# Patient Record
Sex: Female | Born: 1990 | Race: White | Hispanic: No | Marital: Single | Smoking: Former smoker
Health system: Southern US, Community
[De-identification: ages and names within clinical notes are randomized; demographics above are authoritative.]

---

## 2010-05-01 ENCOUNTER — Emergency Department (HOSPITAL_COMMUNITY)
Admission: EM | Admit: 2010-05-01 | Discharge: 2010-05-01 | Payer: Self-pay | Source: Home / Self Care | Admitting: Emergency Medicine

## 2010-10-30 LAB — DIFFERENTIAL
Basophils Absolute: 0 10*3/uL (ref 0.0–0.1)
Basophils Relative: 0 % (ref 0–1)
Lymphocytes Relative: 7 % — ABNORMAL LOW (ref 12–46)
Monocytes Absolute: 0.7 10*3/uL (ref 0.1–1.0)
Neutro Abs: 9 10*3/uL — ABNORMAL HIGH (ref 1.7–7.7)
Neutrophils Relative %: 86 % — ABNORMAL HIGH (ref 43–77)

## 2010-10-30 LAB — CBC
HCT: 37.3 % (ref 36.0–46.0)
MCHC: 34.8 g/dL (ref 30.0–36.0)
Platelets: 192 10*3/uL (ref 150–400)
RDW: 11.9 % (ref 11.5–15.5)
WBC: 10.5 10*3/uL (ref 4.0–10.5)

## 2010-10-30 LAB — URINALYSIS, ROUTINE W REFLEX MICROSCOPIC
Bilirubin Urine: NEGATIVE
Ketones, ur: NEGATIVE mg/dL
Leukocytes, UA: NEGATIVE
Nitrite: POSITIVE — AB
Urobilinogen, UA: 0.2 mg/dL (ref 0.0–1.0)
pH: 5.5 (ref 5.0–8.0)

## 2010-10-30 LAB — POCT PREGNANCY, URINE: Preg Test, Ur: NEGATIVE

## 2010-10-30 LAB — BASIC METABOLIC PANEL
BUN: 7 mg/dL (ref 6–23)
Calcium: 9.5 mg/dL (ref 8.4–10.5)
Creatinine, Ser: 0.8 mg/dL (ref 0.4–1.2)
GFR calc non Af Amer: >60 mL/min (ref >60)
Glucose, Bld: 94 mg/dL (ref 70–99)

## 2010-10-30 LAB — URINE CULTURE
Colony Count: >=100000
Culture  Setup Time: 201109152016

## 2010-10-30 LAB — URINE MICROSCOPIC-ADD ON

## 2011-05-11 ENCOUNTER — Emergency Department (HOSPITAL_COMMUNITY)
Admission: EM | Admit: 2011-05-11 | Discharge: 2011-05-11 | Disposition: A | Discharge status: Home / Self Care | Payer: No Typology Code available for payment source | Attending: Emergency Medicine | Admitting: Emergency Medicine

## 2011-05-11 DIAGNOSIS — R000 Tachycardia, unspecified: Secondary | ICD-10-CM | POA: Insufficient documentation

## 2011-05-11 DIAGNOSIS — R002 Palpitations: Secondary | ICD-10-CM | POA: Insufficient documentation

## 2011-05-11 DIAGNOSIS — R209 Unspecified disturbances of skin sensation: Secondary | ICD-10-CM | POA: Insufficient documentation

## 2011-05-11 LAB — COMPREHENSIVE METABOLIC PANEL
Albumin: 4.3 g/dL (ref 3.5–5.2)
BUN: 12 mg/dL (ref 6–23)
Calcium: 10.3 mg/dL (ref 8.4–10.5)
GFR calc Af Amer: >60 mL/min (ref >60)
Glucose, Bld: 100 mg/dL — ABNORMAL HIGH (ref 70–99)
Total Protein: 7.6 g/dL (ref 6.0–8.3)

## 2011-05-11 LAB — CK TOTAL AND CKMB (NOT AT ARMC)
CK, MB: 1.6 ng/mL (ref 0.3–4.0)
Relative Index: 0.9 (ref 0.0–2.5)
Total CK: 176 U/L (ref 7–177)

## 2011-05-11 LAB — CBC
HCT: 40.3 % (ref 36.0–46.0)
Platelets: 242 10*3/uL (ref 150–400)
RBC: 4.42 MIL/uL (ref 3.87–5.11)
RDW: 11.7 % (ref 11.5–15.5)
WBC: 6.4 10*3/uL (ref 4.0–10.5)

## 2011-05-11 LAB — TSH: TSH: 1.962 u[IU]/mL (ref 0.350–4.500)

## 2011-05-11 LAB — DIFFERENTIAL
Basophils Absolute: 0 10*3/uL (ref 0.0–0.1)
Eosinophils Absolute: 0 10*3/uL (ref 0.0–0.7)
Eosinophils Relative: 1 % (ref 0–5)
Lymphocytes Relative: 21 % (ref 12–46)
Neutrophils Relative %: 69 % (ref 43–77)

## 2011-06-13 IMAGING — CT CT ABD-PELV W/ CM
1 series · 15 of 32 positions shown, 19 images · IV contrast (agent unspecified)
Comparison: None

CLINICAL DATA: Abdominal pain

CT ABDOMEN AND PELVIS WITH CONTRAST
TECHNIQUE: Multidetector CT imaging of the abdomen and pelvis was
performed following the standard protocol during bolus
administration of intravenous contrast.
Contrast: 100 ml of omni 300

[Series 2: rtn ap with st · axial · 0.59mm/px · z∈[+768,+1148]mm · 15 of 85 slices shown, 19 images]
[im 6/85  soft-tissue]
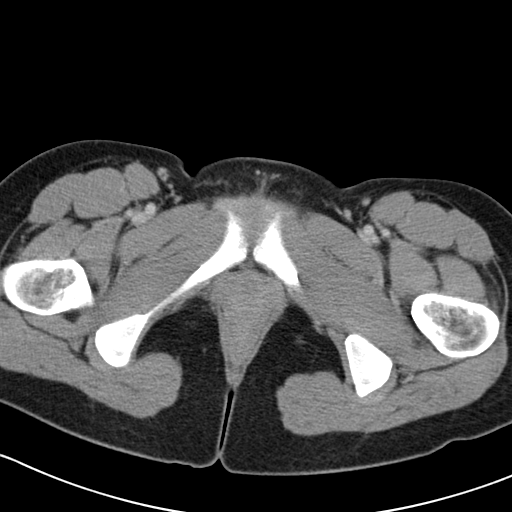
[im 6/85  bone]
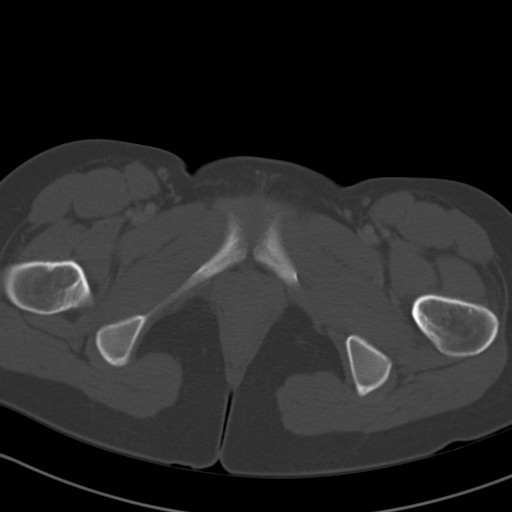
[im 11/85  soft-tissue]
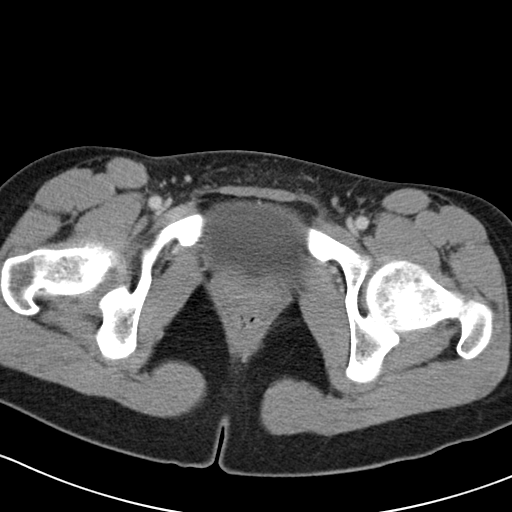
[im 17/85  soft-tissue]
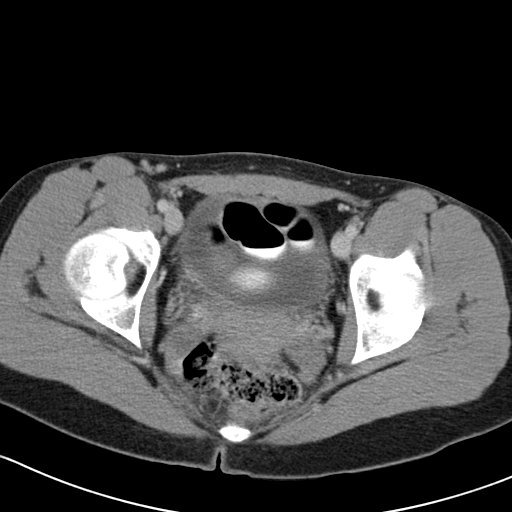
[im 25/85  soft-tissue]
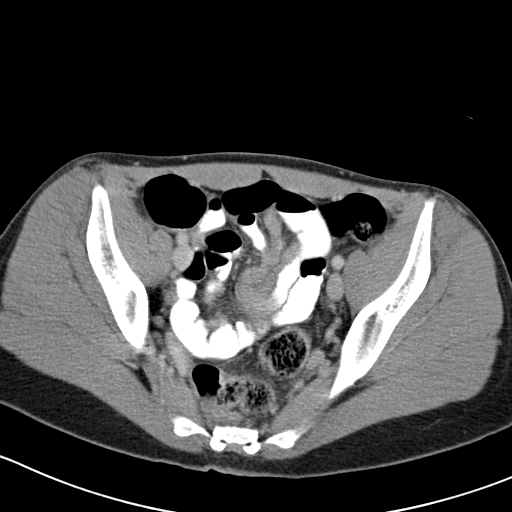
[im 30/85  soft-tissue]
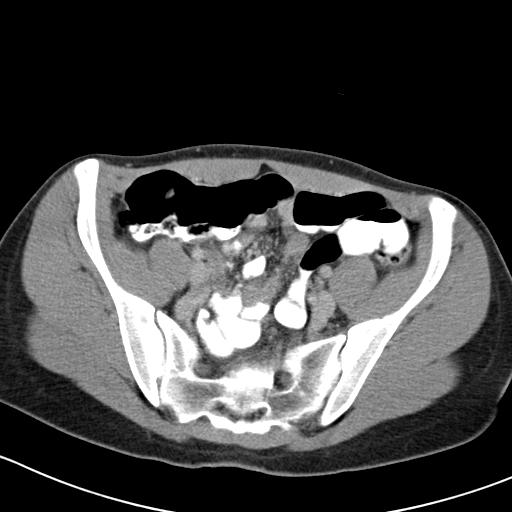
[im 36/85  soft-tissue]
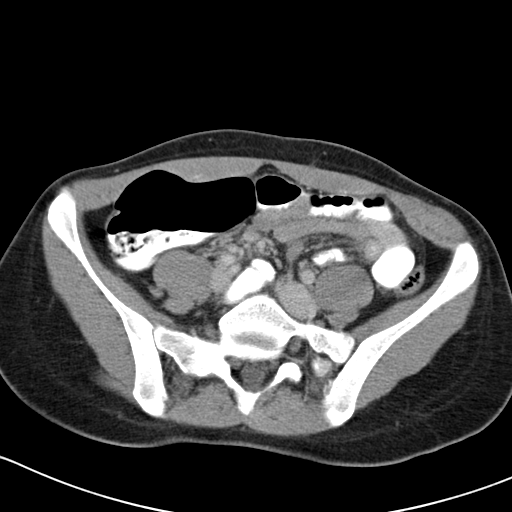
[im 44/85  soft-tissue]
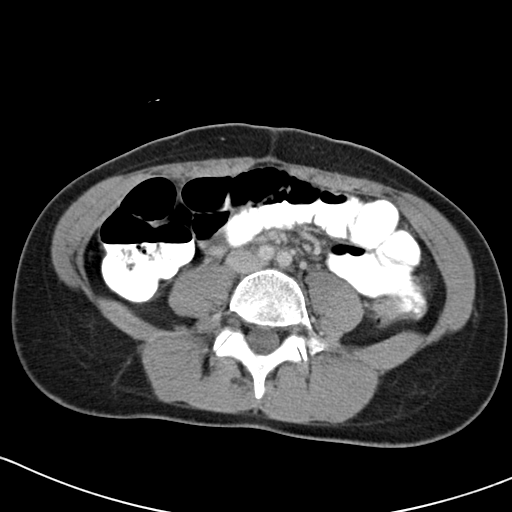
[im 49/85  soft-tissue]
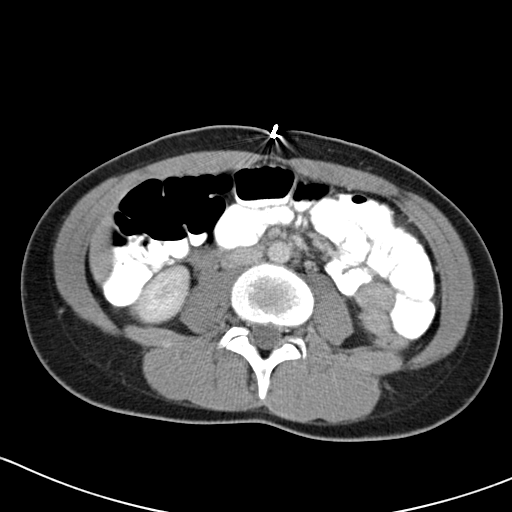
[im 55/85  soft-tissue]
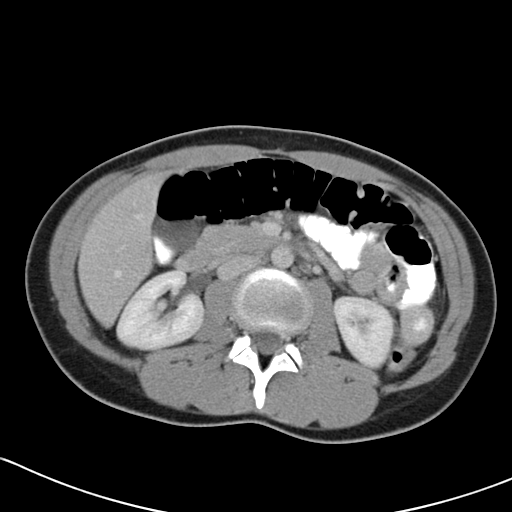
[im 55/85  bone]
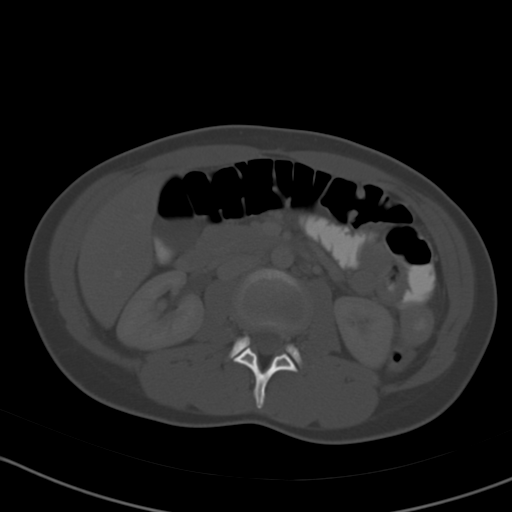
[im 60/85  soft-tissue]
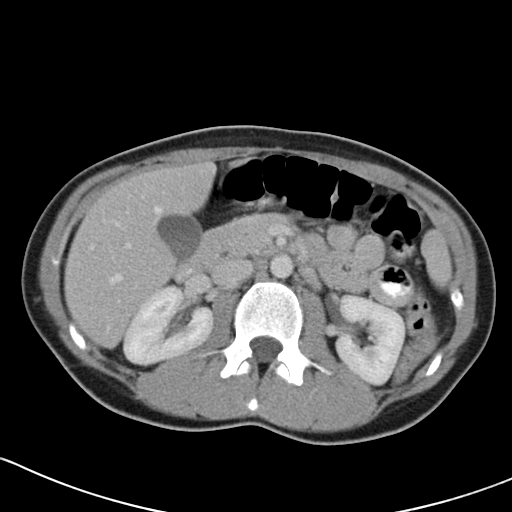
[im 68/85  soft-tissue]
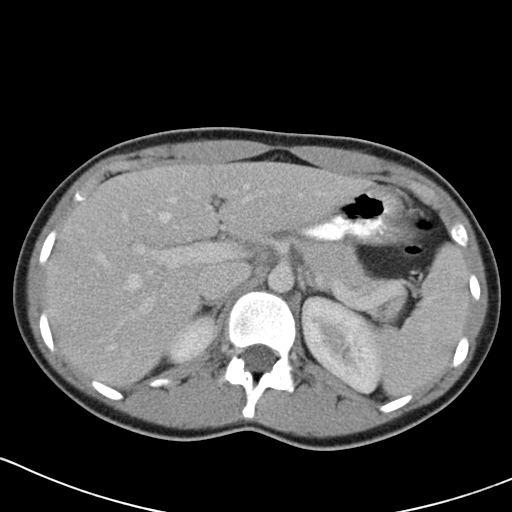
[im 74/85  soft-tissue]
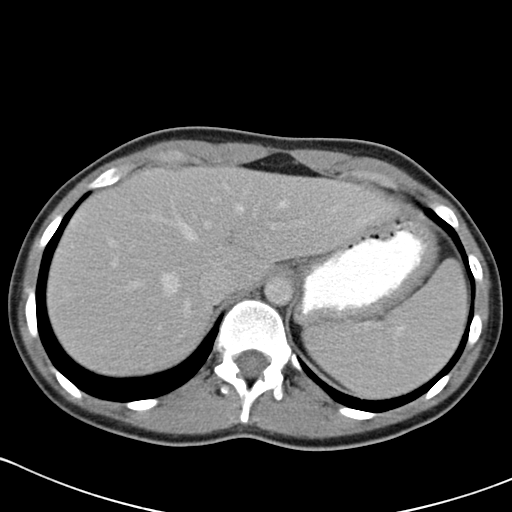
[im 74/85  lung]
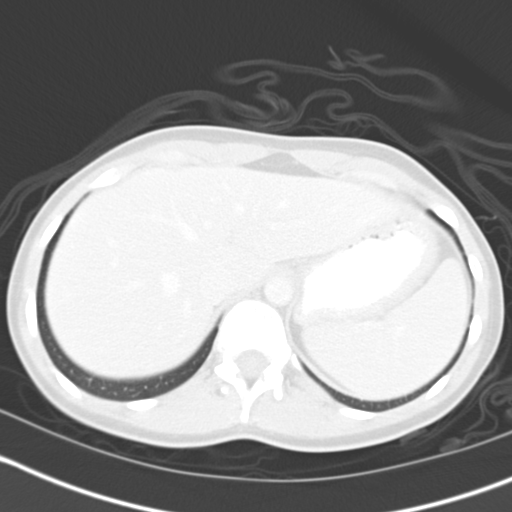
[im 76/85  lung]
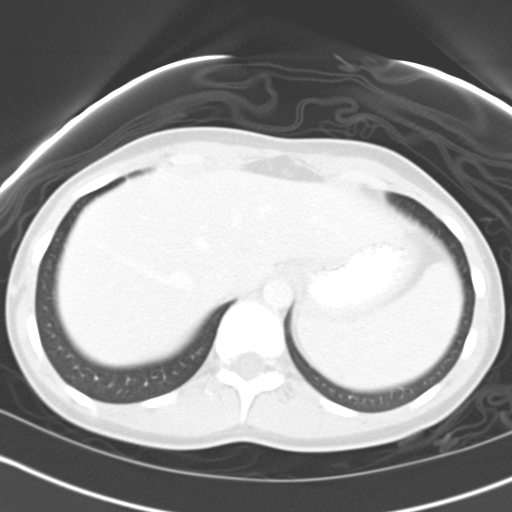
[im 79/85  soft-tissue]
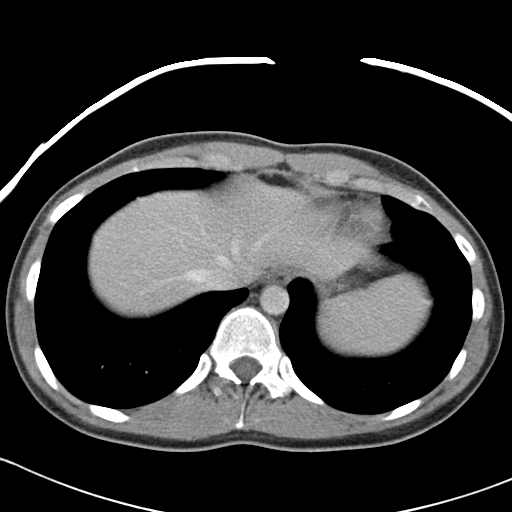
[im 79/85  lung]
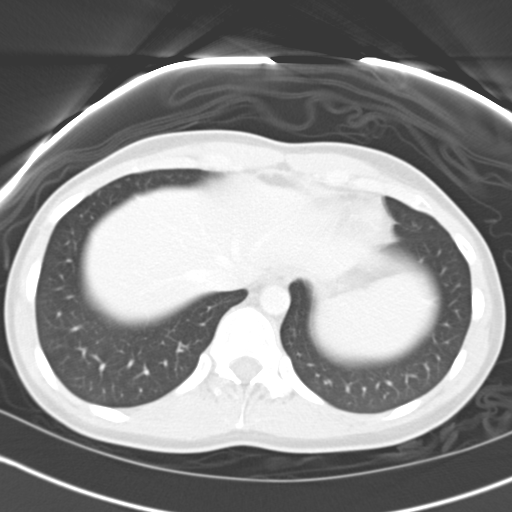
[im 82/85  lung]
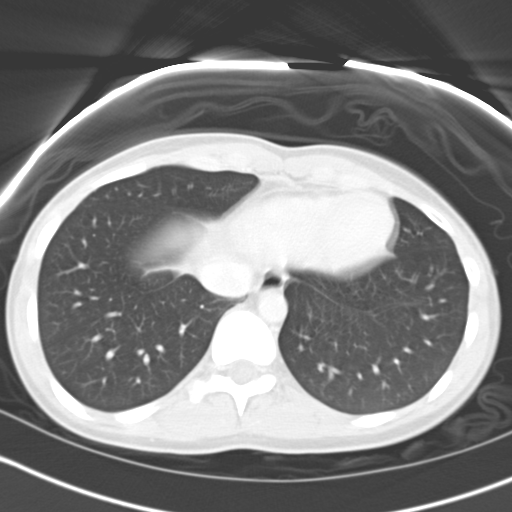

[15 of 32 positions shown; findings below may reference images not displayed]

FINDINGS: The lung bases are clear.

No pericardial or pleural effusions identified.

The spleen appears normal.

The adrenal glands both appear normal.

The pancreas appears normal.

There is no focal liver abnormalities identified.

The gallbladder appears normal. No biliary ductal dilatation
identified.

Normal appearance of both kidneys.

No enlarged upper abdominal lymph nodes.

There is no free fluid within the upper abdomen.

The appendix is identified within the right iliac fossa.  The
appendix measures up to 7.4 mm in thickness.  No significant peri
appendiceal fat stranding or free fluid.

The upper abdominal bowel loops are normal in their course and
caliber without obstruction. There is no free fluid or abnormal
fluid collections identified within the abdomen or pelvis.

The uterus and adnexal structures have a normal physiologic
appearance

The urinary bladder is normal.

Review of the visualized osseous structures is negative
IMPRESSION: 1.  Examination is equivocal for acute appendicitis.  The appendix
is identified within the right lower quadrant and the diameter of
the appendix is upper limits of normal measuring 7.4 mm (normal 6
mm or less).  There is also mild appendiceal wall enhancement.  No
periappendiceal fat stranding or free fluid is identified.

## 2011-10-08 ENCOUNTER — Ambulatory Visit (INDEPENDENT_AMBULATORY_CARE_PROVIDER_SITE_OTHER): Payer: No Typology Code available for payment source

## 2011-10-11 ENCOUNTER — Ambulatory Visit (INDEPENDENT_AMBULATORY_CARE_PROVIDER_SITE_OTHER): Payer: No Typology Code available for payment source | Admitting: Family Medicine

## 2011-10-11 VITALS — BP 133/82 | HR 81 | Temp 98.9°F | Resp 18 | Ht 66.25 in | Wt 121.2 lb

## 2011-10-11 DIAGNOSIS — F909 Attention-deficit hyperactivity disorder, unspecified type: Secondary | ICD-10-CM

## 2011-10-11 DIAGNOSIS — K0501 Acute gingivitis, non-plaque induced: Secondary | ICD-10-CM

## 2011-10-11 MED ORDER — PENICILLIN V POTASSIUM 500 MG PO TABS: 500.0000 mg | ORAL_TABLET | Freq: Three times a day (TID) | ORAL | Status: AC

## 2011-10-11 NOTE — Progress Notes (Signed)
21 yo STUDENT at [employer] who will be going to [place] this summer.  Has two issues:  Bumps on roof of mouth for about 5 days.  Also has moles that need checking on her back (no longer sun bathes)  O:  Mild gingivitis Atypical moles on back  A:  Gingivitis Atypical moles  P:  pcn Schedule bx

## 2011-10-24 ENCOUNTER — Ambulatory Visit (INDEPENDENT_AMBULATORY_CARE_PROVIDER_SITE_OTHER): Payer: No Typology Code available for payment source | Admitting: Family Medicine

## 2011-10-24 VITALS — BP 102/70 | HR 75 | Temp 99.0°F | Resp 16 | Ht 67.0 in | Wt 122.0 lb

## 2011-10-24 DIAGNOSIS — I781 Nevus, non-neoplastic: Secondary | ICD-10-CM

## 2011-10-24 DIAGNOSIS — D229 Melanocytic nevi, unspecified: Secondary | ICD-10-CM

## 2011-10-24 NOTE — Progress Notes (Signed)
21 yo woman here for skin bx  @[email or web address] @  Patient ID: Heather Suarez MRN: [id number], DOB: [birth year], 21 y.o. Date of Encounter: 10/24/2011, 2:18 PM   PROCEDURE NOTE: Verbal consent obtained. Sterile technique employed. Numbing: Anesthesia obtained with 1% with ep  Cleansed with soap and water. Irrigated. Betadine prep per usual protocol.  Wound explored, no deep structures involved, no foreign bodies.   Wound repaired with # 5-0 x 3 Hemostasis obtained. Wound cleansed and dressed.  Wound care instructions including precautions covered with patient. Handout given.  Anticipate suture removal in 7 days  Signed, Cylis Ayars 10/24/2011 2:18 PM

## 2011-10-30 ENCOUNTER — Ambulatory Visit (INDEPENDENT_AMBULATORY_CARE_PROVIDER_SITE_OTHER): Payer: No Typology Code available for payment source | Admitting: Family Medicine

## 2011-10-30 VITALS — BP 119/76 | HR 85 | Temp 98.7°F | Resp 16 | Ht 66.0 in | Wt 122.0 lb

## 2011-10-30 DIAGNOSIS — Z4802 Encounter for removal of sutures: Secondary | ICD-10-CM

## 2011-10-30 DIAGNOSIS — D235 Other benign neoplasm of skin of trunk: Secondary | ICD-10-CM

## 2011-10-30 NOTE — Progress Notes (Signed)
Sutures removed. Well healed.

## 2011-11-01 ENCOUNTER — Emergency Department (HOSPITAL_COMMUNITY)
Admission: EM | Admit: 2011-11-01 | Discharge: 2011-11-02 | Disposition: A | Discharge status: Home / Self Care | Payer: No Typology Code available for payment source | Attending: Emergency Medicine | Admitting: Emergency Medicine

## 2011-11-01 ENCOUNTER — Encounter (HOSPITAL_COMMUNITY): Payer: Self-pay | Admitting: *Deleted

## 2011-11-01 DIAGNOSIS — H109 Unspecified conjunctivitis: Secondary | ICD-10-CM | POA: Insufficient documentation

## 2011-11-01 DIAGNOSIS — H5710 Ocular pain, unspecified eye: Secondary | ICD-10-CM | POA: Insufficient documentation

## 2011-11-01 NOTE — ED Notes (Signed)
Rt draining since this am.  Painful no itching

## 2011-11-02 MED ORDER — ERYTHROMYCIN 5 MG/GM OP OINT: TOPICAL_OINTMENT | OPHTHALMIC | Status: AC

## 2011-11-02 MED ORDER — TETRACAINE HCL 0.5 % OP SOLN
2.0000 [drp] | Freq: Once | OPHTHALMIC | Status: AC
  Administered 2011-11-02: 2 [drp] via OPHTHALMIC
  Filled 2011-11-02: qty 2

## 2011-11-02 MED ORDER — FLUORESCEIN SODIUM 1 MG OP STRP
1.0000 | ORAL_STRIP | Freq: Once | OPHTHALMIC | Status: AC
  Administered 2011-11-02: 1 via OPHTHALMIC
  Filled 2011-11-02: qty 1

## 2011-11-02 MED ORDER — HYDROCODONE-ACETAMINOPHEN 5-325 MG PO TABS: 1.0000 | ORAL_TABLET | ORAL | Status: AC | PRN

## 2011-11-02 NOTE — ED Provider Notes (Signed)
History     CSN: [id number]  Arrival date & time 11/01/11  2229   First MD Initiated Contact with Patient 11/02/11 0128      Chief Complaint  Patient presents with  . Eye Pain     Patient is a 21 y.o. female presenting with eye pain. The history is provided by the patient.  Eye Pain This is a new problem. The current episode started yesterday. The problem occurs constantly. The problem has been gradually worsening. Pertinent negatives include no congestion or fever. She has tried nothing for the symptoms.  Patient reports pain and irritation to right that started yesterday and has worsened. She also notes there has been a green discharge coming from the right thigh as well. States that she wore some glitter type eyeshadow on Saturday night and was unsure if maybe she had scratched her eye with the glitter eye shadow or if this was an infection. Denies fever or recent upper respiratory symptoms.  History reviewed. No pertinent past medical history.  History reviewed. No pertinent past surgical history.  History reviewed. No pertinent family history.  History  Substance Use Topics  . Smoking status: Former Games developer  . Smokeless tobacco: Not on file  . Alcohol Use: Yes     few times weekly    OB History    Grav Para Term Preterm Abortions TAB SAB Ect Mult Living                  Review of Systems  Constitutional: Negative.  Negative for fever.  HENT: Negative.  Negative for congestion.   Eyes: Positive for pain.  Respiratory: Negative.   Cardiovascular: Negative.   Gastrointestinal: Negative.   Genitourinary: Negative.   Musculoskeletal: Negative.   Skin: Negative.   Neurological: Negative.   Hematological: Negative.   Psychiatric/Behavioral: Negative.     Allergies  Review of patient's allergies indicates no known allergies.  Home Medications   Current Outpatient Rx  Name Route Sig Dispense Refill  . AMPHETAMINE-DEXTROAMPHET ER 15 MG PO CP24 Oral Take 15 mg by  mouth every morning.    Colleen Can 1/20 PO Oral Take 1 tablet by mouth daily.    . ERYTHROMYCIN 5 MG/GM OP OINT  Place a 1/2 inch ribbon of ointment into the lower eyelid q4h while awake for 7 days. 1 g 0  . HYDROCODONE-ACETAMINOPHEN 5-325 MG PO TABS Oral Take 1 tablet by mouth every 4 (four) hours as needed for pain (1-2 PO q4h PRN pain). 15 tablet 0    BP 122/83  Pulse 69  Temp(Src) 98.2 F (36.8 C) (Oral)  Resp 16  SpO2 98%  LMP 10/09/2011  Physical Exam  Constitutional: She is oriented to person, place, and time. She appears well-developed and well-nourished.  HENT:  Head: Normocephalic and atraumatic.  Eyes: EOM are normal. Pupils are equal, round, and reactive to light. Right eye exhibits discharge. Right conjunctiva is injected. Right conjunctiva has no hemorrhage. No scleral icterus.  Slit lamp exam:      The right eye shows no corneal abrasion, no corneal ulcer, no foreign body, no fluorescein uptake and no anterior chamber bulge.       No obvious corneal abrasion noted  Cardiovascular: Normal rate.   Pulmonary/Chest: Effort normal.  Musculoskeletal: Normal range of motion.  Neurological: She is alert and oriented to person, place, and time.  Skin: Skin is warm and dry.  Psychiatric: She has a normal mood and affect.    ED Course  Procedures   Findings and clinical impression discussed with patient. We will plan to discharge home on erythromycin ophthalmic ointment every 4 hours x7 days and a short course of medication for pain. Will provide ophthalmology referral and encourage followup within 24-48 hours if symptoms are not improving. Patient agreeable with plan.  Labs Reviewed - No data to display No results found.   1. Conjunctivitis       MDM  HPI/PE and clinical findings c/w 1. Conjunctivitis (R) eye (under exam with woods lamp  there were no findings c/w corneal abrasion) Will treat with erythromycin ointment, provide short course of medication for pain and  encourage followup in 24-48 hours with ophthalmology if no improvement in symptoms.        Leanne Chang, NP 11/02/11 [phone]

## 2011-11-02 NOTE — Discharge Instructions (Signed)
Please review the instructions below. We examined your eye and found no obvious corneal abrasion. Your symptoms are consistent with an eye infection also called conjunctivitis. We're giving you an antibiotic ointment to put in your eye every 4 hours while awake for 7 days. We have also prescribed a short course of medication for pain, please take as directed, if your symptoms have not started to improve in the next 2-3 days we recommend you follow up with the ophthalmologist referral provided. Return for worsening symptoms.  Conjunctivitis Conjunctivitis is commonly called "pink eye." Conjunctivitis can be caused by bacterial or viral infection, allergies, or injuries. There is usually redness of the lining of the eye, itching, discomfort, and sometimes discharge. There may be deposits of matter along the eyelids. A viral infection usually causes a watery discharge, while a bacterial infection causes a yellowish, thick discharge. Pink eye is very contagious and spreads by direct contact. You may be given antibiotic eyedrops as part of your treatment. Before using your eye medicine, remove all drainage from the eye by washing gently with warm water and cotton balls. Continue to use the medication until you have awakened 2 mornings in a row without discharge from the eye. Do not rub your eye. This increases the irritation and helps spread infection. Use separate towels from other household members. Wash your hands with soap and water before and after touching your eyes. Use cold compresses to reduce pain and sunglasses to relieve irritation from light. Do not wear contact lenses or wear eye makeup until the infection is gone. SEEK MEDICAL CARE IF:   Your symptoms are not better after 3 days of treatment.   You have increased pain or trouble seeing.   The outer eyelids become very red or swollen.  Document Released: 09/10/2004 Document Revised: 07/23/2011 Document Reviewed: 08/03/2005 [facility] Patient  Information 2012 [place], [place].

## 2011-11-02 NOTE — ED Provider Notes (Signed)
Medical screening examination/treatment/procedure(s) were performed by non-physician practitioner and as supervising physician I was immediately available for consultation/collaboration.   Hanley Seamen, MD 11/02/11 [phone]

## 2011-11-02 NOTE — ED Notes (Signed)
Patient is AOx4 and comfortable with her discharge instructions. 

## 2012-11-24 ENCOUNTER — Other Ambulatory Visit: Payer: Self-pay | Admitting: Nurse Practitioner

## 2012-12-01 ENCOUNTER — Ambulatory Visit
Admission: RE | Admit: 2012-12-01 | Discharge: 2012-12-01 | Disposition: A | Discharge status: Home / Self Care | Payer: No Typology Code available for payment source | Source: Ambulatory Visit | Attending: Nurse Practitioner | Admitting: Nurse Practitioner

## 2013-11-22 ENCOUNTER — Other Ambulatory Visit: Payer: Self-pay | Admitting: Family Medicine

## 2013-11-22 DIAGNOSIS — N6310 Unspecified lump in the right breast, unspecified quadrant: Secondary | ICD-10-CM

## 2013-12-04 ENCOUNTER — Ambulatory Visit
Admission: RE | Admit: 2013-12-04 | Discharge: 2013-12-04 | Disposition: A | Discharge status: Home / Self Care | Payer: No Typology Code available for payment source | Source: Ambulatory Visit | Attending: Family Medicine | Admitting: Family Medicine

## 2013-12-04 DIAGNOSIS — N6310 Unspecified lump in the right breast, unspecified quadrant: Secondary | ICD-10-CM

## 2014-12-27 ENCOUNTER — Telehealth: Payer: Self-pay | Admitting: Family Medicine

## 2014-12-27 NOTE — Telephone Encounter (Signed)
[facility] called and stated that they received some records on this patient however they are missing the pathology report. Please call them at [phone]. Their fax number is [phone].

## 2014-12-27 NOTE — Telephone Encounter (Signed)
Report faxed with confirmation
